# Patient Record
Sex: Female | Born: 1945 | Race: White | Hispanic: No | Marital: Married | Smoking: Never smoker
Health system: Southern US, Community
[De-identification: ages and names within clinical notes are randomized; demographics above are authoritative.]

## PROBLEM LIST (undated history)

## (undated) DIAGNOSIS — E119 Type 2 diabetes mellitus without complications: Secondary | ICD-10-CM

## (undated) DIAGNOSIS — Z9221 Personal history of antineoplastic chemotherapy: Secondary | ICD-10-CM

## (undated) DIAGNOSIS — I4891 Unspecified atrial fibrillation: Secondary | ICD-10-CM

## (undated) DIAGNOSIS — C569 Malignant neoplasm of unspecified ovary: Secondary | ICD-10-CM

## (undated) HISTORY — PX: OOPHORECTOMY: SHX86

## (undated) HISTORY — PX: CHOLECYSTECTOMY: SHX55

## (undated) HISTORY — PX: TUBAL LIGATION: SHX77

## (undated) HISTORY — PX: TONSILLECTOMY: SUR1361

---

## 2012-07-11 DIAGNOSIS — M8580 Other specified disorders of bone density and structure, unspecified site: Secondary | ICD-10-CM | POA: Insufficient documentation

## 2014-07-20 ENCOUNTER — Ambulatory Visit: Payer: Self-pay | Admitting: Physician Assistant

## 2014-07-20 ENCOUNTER — Emergency Department: Payer: Self-pay | Admitting: Emergency Medicine

## 2014-07-20 LAB — URINALYSIS, COMPLETE
BILIRUBIN, UR: NEGATIVE
Glucose,UR: 50 mg/dL (ref 0–75)
Leukocyte Esterase: NEGATIVE
NITRITE: NEGATIVE
PROTEIN: NEGATIVE
Ph: 5 (ref 4.5–8.0)
RBC,UR: 164 /HPF (ref 0–5)
Specific Gravity: 1.023 (ref 1.003–1.030)
WBC UR: 2 /HPF (ref 0–5)

## 2014-07-20 LAB — BASIC METABOLIC PANEL
Anion Gap: 6 — ABNORMAL LOW (ref 7–16)
BUN: 16 mg/dL (ref 7–18)
CHLORIDE: 106 mmol/L (ref 98–107)
CREATININE: 0.95 mg/dL (ref 0.60–1.30)
Calcium, Total: 8.9 mg/dL (ref 8.5–10.1)
Co2: 26 mmol/L (ref 21–32)
EGFR (African American): >60
EGFR (Non-African Amer.): >60
GLUCOSE: 148 mg/dL — AB (ref 65–99)
Osmolality: 280 (ref 275–301)
Potassium: 4 mmol/L (ref 3.5–5.1)
Sodium: 138 mmol/L (ref 136–145)

## 2014-07-20 LAB — CBC
HCT: 38.7 % (ref 35.0–47.0)
HGB: 12.6 g/dL (ref 12.0–16.0)
MCH: 33.2 pg (ref 26.0–34.0)
MCHC: 32.5 g/dL (ref 32.0–36.0)
MCV: 102 fL — ABNORMAL HIGH (ref 80–100)
Platelet: 113 10*3/uL — ABNORMAL LOW (ref 150–440)
RBC: 3.78 10*6/uL — ABNORMAL LOW (ref 3.80–5.20)
RDW: 14.2 % (ref 11.5–14.5)
WBC: 6.7 10*3/uL (ref 3.6–11.0)

## 2014-07-20 LAB — PROTIME-INR
INR: 1.4
Prothrombin Time: 17 secs — ABNORMAL HIGH (ref 11.5–14.7)

## 2014-10-08 ENCOUNTER — Ambulatory Visit: Payer: Self-pay | Admitting: Family Medicine

## 2014-10-11 HISTORY — PX: ABDOMINAL HYSTERECTOMY: SHX81

## 2014-11-19 DIAGNOSIS — Z8543 Personal history of malignant neoplasm of ovary: Secondary | ICD-10-CM | POA: Insufficient documentation

## 2014-11-27 DIAGNOSIS — H40053 Ocular hypertension, bilateral: Secondary | ICD-10-CM | POA: Insufficient documentation

## 2014-12-17 DIAGNOSIS — L738 Other specified follicular disorders: Secondary | ICD-10-CM | POA: Insufficient documentation

## 2015-01-11 DIAGNOSIS — Z79899 Other long term (current) drug therapy: Secondary | ICD-10-CM | POA: Insufficient documentation

## 2015-01-11 DIAGNOSIS — Z5181 Encounter for therapeutic drug level monitoring: Secondary | ICD-10-CM | POA: Insufficient documentation

## 2015-05-27 DIAGNOSIS — Z7901 Long term (current) use of anticoagulants: Secondary | ICD-10-CM | POA: Insufficient documentation

## 2015-05-27 DIAGNOSIS — Z87898 Personal history of other specified conditions: Secondary | ICD-10-CM | POA: Insufficient documentation

## 2015-12-03 DIAGNOSIS — I482 Chronic atrial fibrillation, unspecified: Secondary | ICD-10-CM | POA: Insufficient documentation

## 2016-06-05 DIAGNOSIS — M1711 Unilateral primary osteoarthritis, right knee: Secondary | ICD-10-CM | POA: Insufficient documentation

## 2016-06-16 ENCOUNTER — Ambulatory Visit
Admission: EM | Admit: 2016-06-16 | Discharge: 2016-06-16 | Disposition: A | Discharge status: Home / Self Care | Payer: Medicare Other | Attending: Family Medicine | Admitting: Family Medicine

## 2016-06-16 ENCOUNTER — Ambulatory Visit (INDEPENDENT_AMBULATORY_CARE_PROVIDER_SITE_OTHER): Payer: Medicare Other

## 2016-06-16 DIAGNOSIS — S7001XA Contusion of right hip, initial encounter: Secondary | ICD-10-CM | POA: Diagnosis not present

## 2016-06-16 DIAGNOSIS — S2231XA Fracture of one rib, right side, initial encounter for closed fracture: Secondary | ICD-10-CM | POA: Diagnosis not present

## 2016-06-16 DIAGNOSIS — W19XXXA Unspecified fall, initial encounter: Secondary | ICD-10-CM

## 2016-06-16 DIAGNOSIS — S46911A Strain of unspecified muscle, fascia and tendon at shoulder and upper arm level, right arm, initial encounter: Secondary | ICD-10-CM | POA: Diagnosis not present

## 2016-06-16 HISTORY — DX: Unspecified atrial fibrillation: I48.91

## 2016-06-16 HISTORY — DX: Type 2 diabetes mellitus without complications: E11.9

## 2016-06-16 HISTORY — DX: Malignant neoplasm of unspecified ovary: C56.9

## 2016-06-16 NOTE — Discharge Instructions (Signed)
Contusion A contusion is a deep bruise. Contusions are the result of a blunt injury to tissues and muscle fibers under the skin. The injury causes bleeding under the skin. The skin overlying the contusion may turn blue, purple, or yellow. Minor injuries will give you a painless contusion, but more severe contusions may stay painful and swollen for a few weeks.  CAUSES  This condition is usually caused by a blow, trauma, or direct force to an area of the body. SYMPTOMS  Symptoms of this condition include:  Swelling of the injured area.  Pain and tenderness in the injured area.  Discoloration. The area may have redness and then turn blue, purple, or yellow. DIAGNOSIS  This condition is diagnosed based on a physical exam and medical history. An X-ray, CT scan, or MRI may be needed to determine if there are any associated injuries, such as broken bones (fractures). TREATMENT  Specific treatment for this condition depends on what area of the body was injured. In general, the best treatment for a contusion is resting, icing, applying pressure to (compression), and elevating the injured area. This is often called the RICE strategy. Over-the-counter anti-inflammatory medicines may also be recommended for pain control.  HOME CARE INSTRUCTIONS   Rest the injured area.  If directed, apply ice to the injured area:  Put ice in a plastic bag.  Place a towel between your skin and the bag.  Leave the ice on for 20 minutes, 2-3 times per day.  If directed, apply light compression to the injured area using an elastic bandage. Make sure the bandage is not wrapped too tightly. Remove and reapply the bandage as directed by your health care provider.  If possible, raise (elevate) the injured area above the level of your heart while you are sitting or lying down.  Take over-the-counter and prescription medicines only as told by your health care provider. SEEK MEDICAL CARE IF:  Your symptoms do not  improve after several days of treatment.  Your symptoms get worse.  You have difficulty moving the injured area. SEEK IMMEDIATE MEDICAL CARE IF:   You have severe pain.  You have numbness in a hand or foot.  Your hand or foot turns pale or cold.   This information is not intended to replace advice given to you by your health care provider. Make sure you discuss any questions you have with your health care provider.   Document Released: 09/06/2005 Document Revised: 08/18/2015 Document Reviewed: 04/14/2015 Elsevier Interactive Patient Education 2016 [place]. Rib Fracture A rib fracture is a break or crack in one of the bones of the ribs. The ribs are a group of long, curved bones that wrap around your chest and attach to your spine. They protect your lungs and other organs in the chest cavity. A broken or cracked rib is often painful, but most do not cause other problems. Most rib fractures heal on their own over time. However, rib fractures can be more serious if multiple ribs are broken or if broken ribs move out of place and push against other structures. CAUSES   A direct blow to the chest. For example, this could happen during contact sports, a car accident, or a fall against a hard object.  Repetitive movements with high force, such as pitching a baseball or having severe coughing spells. SYMPTOMS   Pain when you breathe in or cough.  Pain when someone presses on the injured area. DIAGNOSIS  Your caregiver will perform a physical exam.  Various imaging tests may be ordered to confirm the diagnosis and to look for related injuries. These tests may include a chest X-ray, computed tomography (CT), magnetic resonance imaging (MRI), or a bone scan. TREATMENT  Rib fractures usually heal on their own in 1-3 months. The longer healing period is often associated with a continued cough or other aggravating activities. During the healing period, pain control is very important.  Medication is usually given to control pain. Hospitalization or surgery may be needed for more severe injuries, such as those in which multiple ribs are broken or the ribs have moved out of place.  HOME CARE INSTRUCTIONS   Avoid strenuous activity and any activities or movements that cause pain. Be careful during activities and avoid bumping the injured rib.  Gradually increase activity as directed by your caregiver.  Only take over-the-counter or prescription medications as directed by your caregiver. Do not take other medications without asking your caregiver first.  Apply ice to the injured area for the first 1-2 days after you have been treated or as directed by your caregiver. Applying ice helps to reduce inflammation and pain.  Put ice in a plastic bag.  Place a towel between your skin and the bag.   Leave the ice on for 15-20 minutes at a time, every 2 hours while you are awake.  Perform deep breathing as directed by your caregiver. This will help prevent pneumonia, which is a common complication of a broken rib. Your caregiver may instruct you to:  Take deep breaths several times a day.  Try to cough several times a day, holding a pillow against the injured area.  Use a device called an incentive spirometer to practice deep breathing several times a day.  Drink enough fluids to keep your urine clear or pale yellow. This will help you avoid constipation.   Do not wear a rib belt or binder. These restrict breathing, which can lead to pneumonia.  SEEK IMMEDIATE MEDICAL CARE IF:   You have a fever.   You have difficulty breathing or shortness of breath.   You develop a continual cough, or you cough up thick or bloody sputum.  You feel sick to your stomach (nausea), throw up (vomit), or have abdominal pain.   You have worsening pain not controlled with medications.  MAKE SURE YOU:  Understand these instructions.  Will watch your condition.  Will get help right  away if you are not doing well or get worse.   This information is not intended to replace advice given to you by your health care provider. Make sure you discuss any questions you have with your health care provider.   Document Released: 11/27/2005 Document Revised: 07/30/2013 Document Reviewed: 01/29/2013 Elsevier Interactive Patient Education [employer].

## 2016-06-16 NOTE — ED Notes (Signed)
Patient states that on Tuesday evening she went to get out of her chair and she was dizzy. Patient states that she tried to brace herself and she fell onto the hardwood floor at her home. Patient states that she now has right arm muscle pain, right sided rib pain and right sided groin pain. Patient states that pain has worsened since Tuesday.

## 2016-06-16 NOTE — ED Provider Notes (Signed)
CSN: [id number]     Arrival date & time 06/16/16  0902 History   First MD Initiated Contact with Patient 06/16/16 [phone]     Chief Complaint  Patient presents with  . Fall   (Consider location/radiation/quality/duration/timing/severity/associated sxs/prior Treatment) HPI Comments: 70 yo female with a 4 days h/o right sided pain after falling. States she felt dizzy right after standing up from her recliner and fell hitting her right side. Complains of pain to her right mid and lower ribs, right upper arm and right groin area. Denies hitting her head, vision changes, numbness/tingling.   The history is provided by the patient.    Past Medical History  Diagnosis Date  . Diabetes ([place])   . Ovarian cancer ([place])     s/p chemo last treatment 01/2015  . Atrial fibrillation [facility])    Past Surgical History  Procedure Laterality Date  . Tonsillectomy    . Tubal ligation    . Cholecystectomy    . Abdominal hysterectomy  10/2014   Family History  Problem Relation Age of Onset  . Diabetes Mother   . CVA Father   . CVA Mother    Social History  Substance Use Topics  . Smoking status: Never Smoker   . Smokeless tobacco: None  . Alcohol Use: 0.0 oz/week    0 Standard drinks or equivalent per week     Comment: 2 glasses of wine daily   OB History    No data available     Review of Systems  Allergies  Mupirocin  Home Medications   Prior to Admission medications   Medication Sig Start Date End Date Taking? Authorizing Provider  bimatoprost (LUMIGAN) 0.03 % ophthalmic solution 1 drop at bedtime.   Yes Historical Provider, MD  carvedilol (COREG) 25 MG tablet Take 25 mg by mouth 2 (two) times daily with a meal.   Yes Historical Provider, MD  cetirizine (ZYRTEC) 10 MG tablet Take 10 mg by mouth daily.   Yes Historical Provider, MD  ezetimibe (ZETIA) 10 MG tablet Take 10 mg by mouth daily.   Yes Historical Provider, MD  flecainide (TAMBOCOR) 50 MG tablet Take 50 mg by mouth 2 (two) times  daily.   Yes Historical Provider, MD  gabapentin (NEURONTIN) 100 MG capsule Take 100 mg by mouth 3 (three) times daily.   Yes Historical Provider, MD  glucosamine-chondroitin 500-400 MG tablet Take 1 tablet by mouth 3 (three) times daily.   Yes Historical Provider, MD  insulin glargine (LANTUS) 100 UNIT/ML injection Inject 16 Units into the skin at bedtime.   Yes Historical Provider, MD  lisinopril (PRINIVIL,ZESTRIL) 20 MG tablet Take 20 mg by mouth daily.   Yes Historical Provider, MD  milk thistle 175 MG tablet Take 175 mg by mouth daily.   Yes Historical Provider, MD  repaglinide (PRANDIN) 2 MG tablet Take 2 mg by mouth 3 (three) times daily before meals.   Yes Historical Provider, MD  rivaroxaban (XARELTO) 20 MG TABS tablet Take 20 mg by mouth daily with supper.   Yes Historical Provider, MD  vitamin B-12 (CYANOCOBALAMIN) 1000 MCG tablet Take 1,000 mcg by mouth daily.   Yes Historical Provider, MD   Meds Ordered and Administered this Visit  Medications - No data to display  BP 150/103 mmHg  Pulse 82  Temp(Src) 98.4 F (36.9 C) (Tympanic)  Resp 17  Ht 5\' 4"  (1.626 m)  Wt 160 lb (72.576 kg)  BMI 27.45 kg/m2  SpO2 99% No data found.  Physical Exam  Constitutional: She appears well-developed and well-nourished. No distress.  HENT:  Head: Normocephalic and atraumatic.  Right Ear: Tympanic membrane, external ear and ear canal normal.  Left Ear: Tympanic membrane, external ear and ear canal normal.  Nose: No nose lacerations, sinus tenderness, nasal deformity, septal deviation or nasal septal hematoma. No epistaxis.  No foreign bodies.  Mouth/Throat: Uvula is midline, oropharynx is clear and moist and mucous membranes are normal. No oropharyngeal exudate.  Eyes: Conjunctivae and EOM are normal. Pupils are equal, round, and reactive to light. Right eye exhibits no discharge. Left eye exhibits no discharge. No scleral icterus.  Neck: Normal range of motion. Neck supple. No thyromegaly  present.  Cardiovascular: Normal rate, regular rhythm and normal heart sounds.   Pulmonary/Chest: Effort normal and breath sounds normal. No respiratory distress. She has no wheezes. She has no rales. She exhibits tenderness (right mid and lower rib area).  Musculoskeletal: She exhibits tenderness.       Right hip: She exhibits tenderness. She exhibits normal range of motion, normal strength, no swelling, no crepitus, no deformity and no laceration.  Right upper arm neurovascularly intact; normal ROM; no bony tenderness.     Lymphadenopathy:    She has no cervical adenopathy.  Neurological: She is alert.  Skin: She is not diaphoretic.  Nursing note and vitals reviewed.   ED Course  Procedures (including critical care time)  Labs Review Labs Reviewed - No data to display  Imaging Review Dg Ribs Unilateral W/chest Right  06/16/2016  CLINICAL DATA:  Fall 4 days ago injuring right lateral ribs. Bruising lateral side of breast. Right hip pain. EXAM: RIGHT RIBS AND CHEST - 3+ VIEW COMPARISON:  None. FINDINGS: Heart size is upper normal. Overall cardiomediastinal silhouette is within normal limits in size and configuration. Lungs are at least mildly hyperexpanded suggesting COPD. Lungs are clear. No pleural effusion or pneumothorax seen. There is a a minimally displaced fracture of the right lateral sixth rib. No other fracture identified. IMPRESSION: 1. Minimally displaced fracture of the right lateral sixth rib. 2. Probable COPD. 3. Lungs are clear.  No pleural effusion or pneumothorax seen. Electronically Signed   By: Franki Cabot M.D.   On: 06/16/2016 10:47   Dg Hip Unilat With Pelvis 2-3 Views Right  06/16/2016  CLINICAL DATA:  [place] 4 days ago, right hip pain, history of scoliosis EXAM: DG HIP (WITH OR WITHOUT PELVIS) 2-3V RIGHT COMPARISON:  None. FINDINGS: Three views of the right hip submitted. No acute fracture or subluxation. Bilateral hip joints are symmetrical in appearance. Degenerative  changes pubic symphysis. Mild lower lumbar dextroscoliosis. Degenerative changes lumbar spine. There is about 7 mm right lateral subluxation L3 on L4 vertebral body. IMPRESSION: No acute fracture or subluxation. Degenerative changes pubic symphysis and lower lumbar spine. Electronically Signed   By: Lahoma Crocker M.D.   On: 06/16/2016 10:43     Visual Acuity Review  Right Eye Distance:   Left Eye Distance:   Bilateral Distance:    Right Eye Near:   Left Eye Near:    Bilateral Near:         MDM   1. Fracture, rib, right, closed, initial encounter   2. Fall   3. Contusion, hip, right, initial encounter   4. Strain of upper arm, right, initial encounter    Discharge Medication List as of 06/16/2016 11:42 AM    1. x-ray results and diagnosis reviewed with patient 2. Offered rx for pain medication, however  patient refuses; states will take otc acetaminophen and continue her gabapentin 3. Recommend supportive treatment with ice to area 4. Follow-up prn if symptoms worsen or don't improve    Norval Gable, MD 06/16/16 [phone]

## 2016-09-04 ENCOUNTER — Other Ambulatory Visit: Payer: Self-pay | Admitting: Family Medicine

## 2016-09-04 DIAGNOSIS — Z1231 Encounter for screening mammogram for malignant neoplasm of breast: Secondary | ICD-10-CM

## 2016-09-19 ENCOUNTER — Ambulatory Visit: Admission: RE | Admit: 2016-09-19 | Payer: Medicare Other | Source: Ambulatory Visit

## 2016-09-21 ENCOUNTER — Ambulatory Visit
Admission: RE | Admit: 2016-09-21 | Discharge: 2016-09-21 | Disposition: A | Discharge status: Home / Self Care | Payer: Medicare Other | Source: Ambulatory Visit | Attending: Family Medicine | Admitting: Family Medicine

## 2016-09-21 ENCOUNTER — Other Ambulatory Visit: Payer: Self-pay | Admitting: Family Medicine

## 2016-09-21 DIAGNOSIS — Z1231 Encounter for screening mammogram for malignant neoplasm of breast: Secondary | ICD-10-CM | POA: Insufficient documentation

## 2016-09-21 HISTORY — DX: Personal history of antineoplastic chemotherapy: Z92.21

## 2016-11-23 DIAGNOSIS — M2170 Unequal limb length (acquired), unspecified site: Secondary | ICD-10-CM | POA: Insufficient documentation

## 2016-11-24 DIAGNOSIS — M533 Sacrococcygeal disorders, not elsewhere classified: Secondary | ICD-10-CM | POA: Insufficient documentation

## 2017-04-11 DIAGNOSIS — Z5309 Procedure and treatment not carried out because of other contraindication: Secondary | ICD-10-CM | POA: Insufficient documentation

## 2017-04-27 ENCOUNTER — Ambulatory Visit (INDEPENDENT_AMBULATORY_CARE_PROVIDER_SITE_OTHER): Payer: Medicare Other | Admitting: Podiatry

## 2017-04-27 DIAGNOSIS — M2170 Unequal limb length (acquired), unspecified site: Secondary | ICD-10-CM

## 2017-04-27 DIAGNOSIS — L600 Ingrowing nail: Secondary | ICD-10-CM

## 2017-04-27 NOTE — Progress Notes (Signed)
   Subjective: Patient presents today as a new patient for evaluation of pain and tenderness to the 4th digit left foot. Patient states that she's had pain to the lateral aspect with skin flaking from the lateral nail fold for several months now. Very tender to palpation. Patient also complains a little length discrepancy with the right lower extremity shorter than the left lower extremity. She denies trauma regarding the lower extremities or any lower extremity surgery however she does state that that limb length discrepancy contributes to her diagnosis of scoliosis.  Objective:  General: Well developed, nourished, in no acute distress, alert and oriented x3   Dermatology: Skin is warm, dry and supple bilateral. Lateral border of the fourth digit left foot appears to be erythematous with evidence of an ingrowing nail. Pain on palpation noted to the border of the nail fold. The remaining nails appear unremarkable at this time. There are no open sores, lesions.  Vascular: Dorsalis Pedis artery and Posterior Tibial artery pedal pulses palpable. No lower extremity edema noted.   Neruologic: Grossly intact via light touch bilateral.  Musculoskeletal: Limb length discrepancy noted with the right lower extremity shortened left lower extremity of approximately 1.5 cm.   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 Patient evaluated.  2. Discussed treatment alternatives and plan of care. Explained nail avulsion procedure and post procedure course to patient. 3. Patient opted for permanent partial nail avulsion.  4. Prior to procedure, local anesthesia infiltration utilized using 3 ml of a 50:50 mixture of 2% plain lidocaine and 0.5% plain marcaine in a normal hallux block fashion and a betadine prep performed.  5. Partial permanent nail avulsion with chemical  matrixectomy performed using [id number] applications of phenol followed by alcohol flush.  6. Light dressing applied. 7. Today were going to schedule appointment with [facility] for accommodation of the limb length discrepancy. 8. Postoperative shoe was dispensed for the patient.  9. Today authorization for diabetic shoes was initiated 10. Return to clinic in 2 weeks.   Edrick Kins, DPM [facility]  Dr. Edrick Kins, [address]                                        [place], [place] [address]                Office [phone]  Fax [phone]

## 2017-04-27 NOTE — Patient Instructions (Signed)

## 2017-05-18 ENCOUNTER — Encounter: Payer: Self-pay | Admitting: Podiatry

## 2017-05-18 ENCOUNTER — Ambulatory Visit (INDEPENDENT_AMBULATORY_CARE_PROVIDER_SITE_OTHER): Payer: Medicare Other | Admitting: Podiatry

## 2017-05-18 DIAGNOSIS — I5189 Other ill-defined heart diseases: Secondary | ICD-10-CM | POA: Insufficient documentation

## 2017-05-18 DIAGNOSIS — K760 Fatty (change of) liver, not elsewhere classified: Secondary | ICD-10-CM | POA: Insufficient documentation

## 2017-05-18 DIAGNOSIS — I2510 Atherosclerotic heart disease of native coronary artery without angina pectoris: Secondary | ICD-10-CM | POA: Insufficient documentation

## 2017-05-18 DIAGNOSIS — M2170 Unequal limb length (acquired), unspecified site: Secondary | ICD-10-CM | POA: Diagnosis not present

## 2017-05-18 DIAGNOSIS — I10 Essential (primary) hypertension: Secondary | ICD-10-CM | POA: Insufficient documentation

## 2017-05-18 DIAGNOSIS — E119 Type 2 diabetes mellitus without complications: Secondary | ICD-10-CM | POA: Insufficient documentation

## 2017-05-18 DIAGNOSIS — E785 Hyperlipidemia, unspecified: Secondary | ICD-10-CM | POA: Insufficient documentation

## 2017-05-18 DIAGNOSIS — K589 Irritable bowel syndrome without diarrhea: Secondary | ICD-10-CM | POA: Insufficient documentation

## 2017-05-20 NOTE — Progress Notes (Signed)
   Subjective: Patient presents today 2 weeks post ingrown nail permanent nail avulsion procedure of the lateral border of the fourth digit of the left foot. Patient states that the toe and nail fold is feeling much better. She has appt with Liliane Channel on 05/30/17.  Objective: Skin is warm, dry and supple. Nail and respective nail fold appears to be healing appropriately. Open wound to the associated nail fold with a granular wound base and moderate amount of fibrotic tissue. Minimal drainage noted. Mild erythema around the periungual region likely due to phenol chemical matricectomy.  Assessment: #1 postop permanent partial nail avulsion lateral border fourth digit left foot #2 open wound periungual nail fold of respective digit.  #3 for limb length discrepancy-right shorter than left  Plan of care: #1 patient was evaluated  #2 debridement of open wound was performed to the periungual border of the respective toe using a currette. Antibiotic ointment and Band-Aid was applied. #3 Keep appt with Liliane Channel #4 patient is to return to clinic on a PRN  basis.   Edrick Kins, DPM [facility]  Dr. Edrick Kins, [address]                                        [place], [place] [address]                Office [phone]  Fax [phone]

## 2017-05-23 ENCOUNTER — Ambulatory Visit: Payer: Medicare Other

## 2017-05-24 ENCOUNTER — Ambulatory Visit: Payer: Medicare Other

## 2017-05-30 ENCOUNTER — Ambulatory Visit (INDEPENDENT_AMBULATORY_CARE_PROVIDER_SITE_OTHER): Payer: Medicare Other | Admitting: Podiatry

## 2017-05-30 DIAGNOSIS — M2170 Unequal limb length (acquired), unspecified site: Secondary | ICD-10-CM

## 2017-05-30 DIAGNOSIS — M2011 Hallux valgus (acquired), right foot: Secondary | ICD-10-CM

## 2017-05-30 DIAGNOSIS — M2012 Hallux valgus (acquired), left foot: Secondary | ICD-10-CM

## 2017-05-30 NOTE — Progress Notes (Signed)
Patient presents today for shoe lift to address LLD (R). Patient measures approx 3/8 inch discrepncy; however options were discussed and it has been determined best course of action would be DM 2 shoes and inserts with 1/4" lift on right.  Patient chose shoes and were cast in foam for inserts. Paper work wll be sent to Dr Burnett Kanaris at [facility].

## 2017-07-11 ENCOUNTER — Ambulatory Visit: Payer: Medicare Other | Admitting: Orthotics

## 2017-08-08 ENCOUNTER — Ambulatory Visit (INDEPENDENT_AMBULATORY_CARE_PROVIDER_SITE_OTHER): Payer: Medicare Other | Admitting: Orthotics

## 2017-08-08 DIAGNOSIS — M2012 Hallux valgus (acquired), left foot: Secondary | ICD-10-CM

## 2017-08-08 DIAGNOSIS — M2011 Hallux valgus (acquired), right foot: Secondary | ICD-10-CM | POA: Diagnosis not present

## 2017-08-08 DIAGNOSIS — M2170 Unequal limb length (acquired), unspecified site: Secondary | ICD-10-CM

## 2017-08-08 DIAGNOSIS — E119 Type 2 diabetes mellitus without complications: Secondary | ICD-10-CM | POA: Diagnosis not present

## 2017-08-08 DIAGNOSIS — E1141 Type 2 diabetes mellitus with diabetic mononeuropathy: Secondary | ICD-10-CM

## 2017-08-09 NOTE — Progress Notes (Signed)

## 2017-10-05 ENCOUNTER — Other Ambulatory Visit: Payer: Self-pay | Admitting: Family Medicine

## 2017-10-05 DIAGNOSIS — Z1231 Encounter for screening mammogram for malignant neoplasm of breast: Secondary | ICD-10-CM

## 2017-10-29 ENCOUNTER — Ambulatory Visit
Admission: RE | Admit: 2017-10-29 | Discharge: 2017-10-29 | Disposition: A | Discharge status: Home / Self Care | Payer: Medicare Other | Source: Ambulatory Visit | Attending: Family Medicine | Admitting: Family Medicine

## 2017-10-29 DIAGNOSIS — Z1231 Encounter for screening mammogram for malignant neoplasm of breast: Secondary | ICD-10-CM | POA: Diagnosis present

## 2018-08-05 ENCOUNTER — Ambulatory Visit: Payer: Medicare Other | Admitting: Podiatry

## 2018-08-05 ENCOUNTER — Encounter: Payer: Self-pay | Admitting: Podiatry

## 2018-08-05 DIAGNOSIS — M2040 Other hammer toe(s) (acquired), unspecified foot: Secondary | ICD-10-CM

## 2018-08-05 DIAGNOSIS — M76829 Posterior tibial tendinitis, unspecified leg: Secondary | ICD-10-CM | POA: Diagnosis not present

## 2018-08-05 NOTE — Progress Notes (Signed)
This patient presents to the office with 2 separate complaints.  She says she has a painful hammertoe second digit left foot that is painful walking and wearing her shoes.  She says this hammertoe is painful walking and wearing her shoes.  She also says she has pain through the arch of right foot.   This pain has been present for approximately 6-7 weeks.  She says she experiences pain upon rising in the morning and stepping onto the floor.  She says her foot then improves only to become painful by the end of the day.  She has no history of trauma or injury to the foot.  She was previously seen by Dr. Amalia Hailey who treated her limb lengths through [facility].   She says she was unable to wear the heel lift d ue to back  pain.  She presents the office today for an evaluation of her hammertoe, but especially painful right foot.   General Appearance  Alert, conversant and in no acute stress.  Vascular  Dorsalis pedis and posterior tibial  pulses are palpable  bilaterally.  Capillary return is within normal limits  bilaterally. Temperature is within normal limits  bilaterally.  Neurologic  Senn-Weinstein monofilament wire test within normal limits  bilaterally. Muscle power within normal limits bilaterally.  Nails  Normal nails noted with no evidence of bacterial or fungal infection.  Orthopedic  No limitations of motion of motion feet .  No crepitus or effusions noted.  Severe HAV deformity first MPJ bilateral, with hammer toes 2 through 5 bilateral.  Examination of her rear foot reveals pain along the course of the posterior tibial tendon, right foot.  Pain at the medial aspect of the right foot.  Upon standing, she has excessive pronation with posterior tibial tendon dysfunction.  Skin  normotropic skin with no porokeratosis noted bilaterally.  No signs of infections or ulcers noted.   PTTD right foot.  Hammer toe second left  ROV  Examination of her right foot reveals excessive pronation upon weight bearing.     Prescribed power step insoles for this patient.  She says initially she feels much better and there is less pain to the tendon right ankle.  Padding was dispensed to wear on the second toe left foot.   RTC 10 days.   Gardiner Barefoot DPM

## 2018-08-19 ENCOUNTER — Encounter: Payer: Self-pay | Admitting: Podiatry

## 2018-08-19 ENCOUNTER — Ambulatory Visit: Payer: Medicare Other | Admitting: Podiatry

## 2018-08-19 DIAGNOSIS — M76829 Posterior tibial tendinitis, unspecified leg: Secondary | ICD-10-CM

## 2018-08-19 DIAGNOSIS — E1141 Type 2 diabetes mellitus with diabetic mononeuropathy: Secondary | ICD-10-CM | POA: Diagnosis not present

## 2018-08-19 DIAGNOSIS — M2040 Other hammer toe(s) (acquired), unspecified foot: Secondary | ICD-10-CM

## 2018-08-19 NOTE — Progress Notes (Signed)
This patient presents to the office follow-up for a painful right foot.  Patient has posterior tibial tendon dysfunction, which has led to excessive pronation on her right foot.  She was evaluated and treated with power step insoles to be worn in her right foot.  She says that she is about 50 % improved in 2 weeks wearing the powerstep insoles.  She is pleased with her 2 week progress.  She returns the office today for an evaluation and treatment of her right foot.  Vascular  Dorsalis pedis and posterior tibial pulses are palpable  B/L.  Capillary return  WNL.  Temperature gradient is  WNL.  Skin turgor  WNL  Sensorium  Senn Weinstein monofilament wire  WNL. Normal tactile sensation.  Nail Exam  Patient has normal nails with no evidence of bacterial or fungal infection.  Orthopedic  Exam  Muscle tone and muscle strength  WNL.  No limitations of motion feet  B/L.  No crepitus or joint effusion noted.  Examination of her posterior tibial tendon, right foot reveals less swelling  and pain right foot.      Skin  No open lesions.  Normal skin texture and turgor.   PTTD right foot  ROV.  Discussed this condition with this patient.  We decided to continue to wear the power step insoles for another 2 weeks.  At that juncture, we can reevaluate her for possible orthotic therapy or an AFO device.     Gardiner Barefoot DPM

## 2018-10-14 ENCOUNTER — Other Ambulatory Visit: Payer: Self-pay | Admitting: Family Medicine

## 2018-10-14 DIAGNOSIS — Z1231 Encounter for screening mammogram for malignant neoplasm of breast: Secondary | ICD-10-CM

## 2018-10-30 ENCOUNTER — Encounter (INDEPENDENT_AMBULATORY_CARE_PROVIDER_SITE_OTHER): Payer: Self-pay

## 2018-10-30 ENCOUNTER — Ambulatory Visit
Admission: RE | Admit: 2018-10-30 | Discharge: 2018-10-30 | Disposition: A | Discharge status: Home / Self Care | Payer: Medicare Other | Source: Ambulatory Visit | Attending: Family Medicine | Admitting: Family Medicine

## 2018-10-30 DIAGNOSIS — Z1231 Encounter for screening mammogram for malignant neoplasm of breast: Secondary | ICD-10-CM | POA: Insufficient documentation

## 2019-08-23 IMAGING — MG DIGITAL SCREENING BILATERAL MAMMOGRAM WITH TOMO AND CAD
8 series · 8 of 24 positions shown · non-contrast
Comparison: Previous exam(s).

CLINICAL DATA: Screening.

EXAM:
DIGITAL SCREENING BILATERAL MAMMOGRAM WITH TOMO AND CAD

[R CC synth-2D]
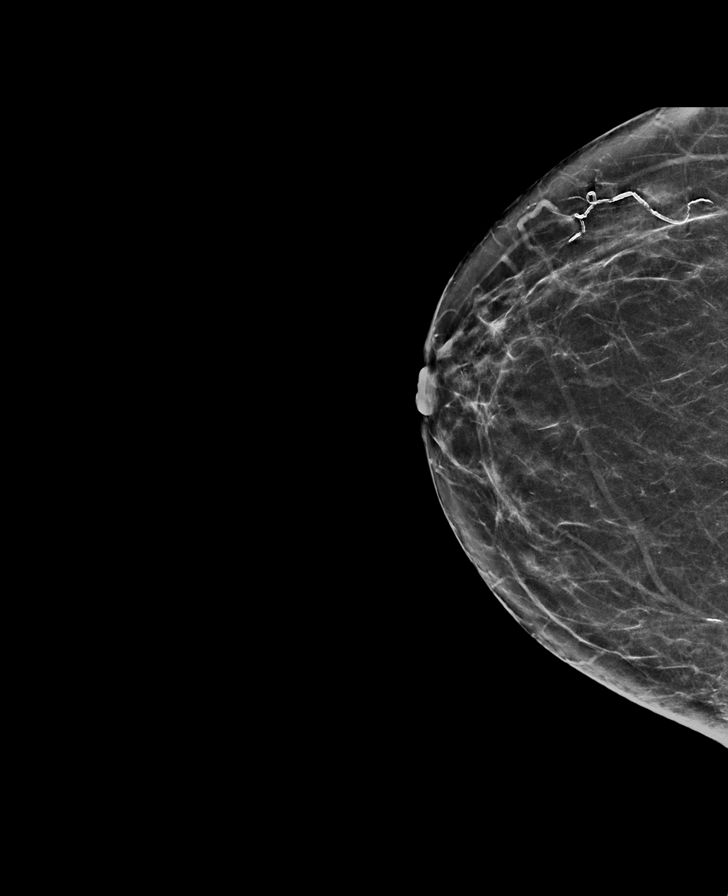

[L CC synth-2D]
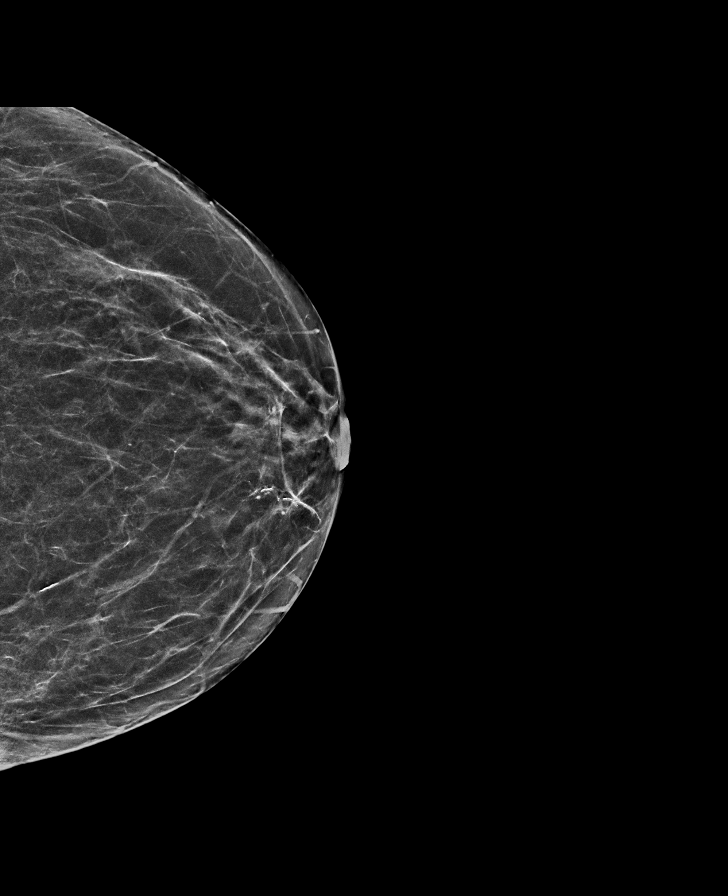

[L MLO synth-2D]
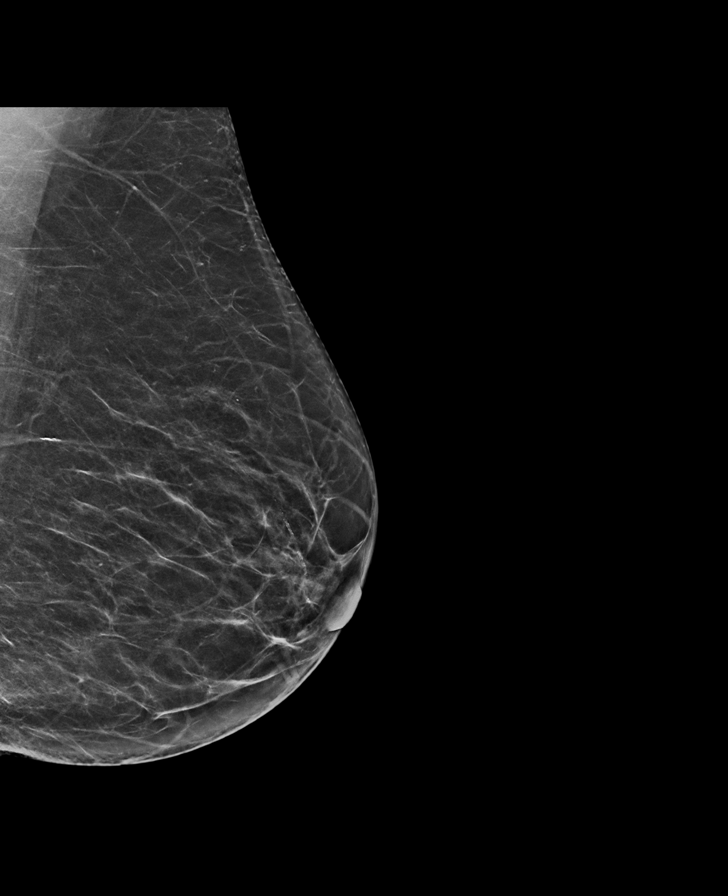

[R MLO synth-2D]
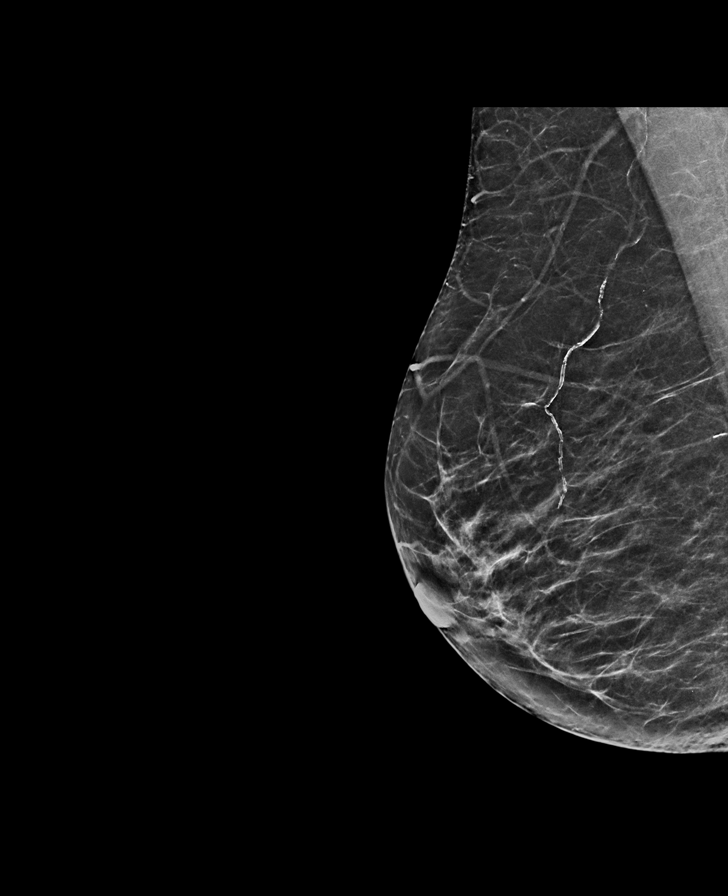

[L CC tomo · tomo slice 31/60.0]
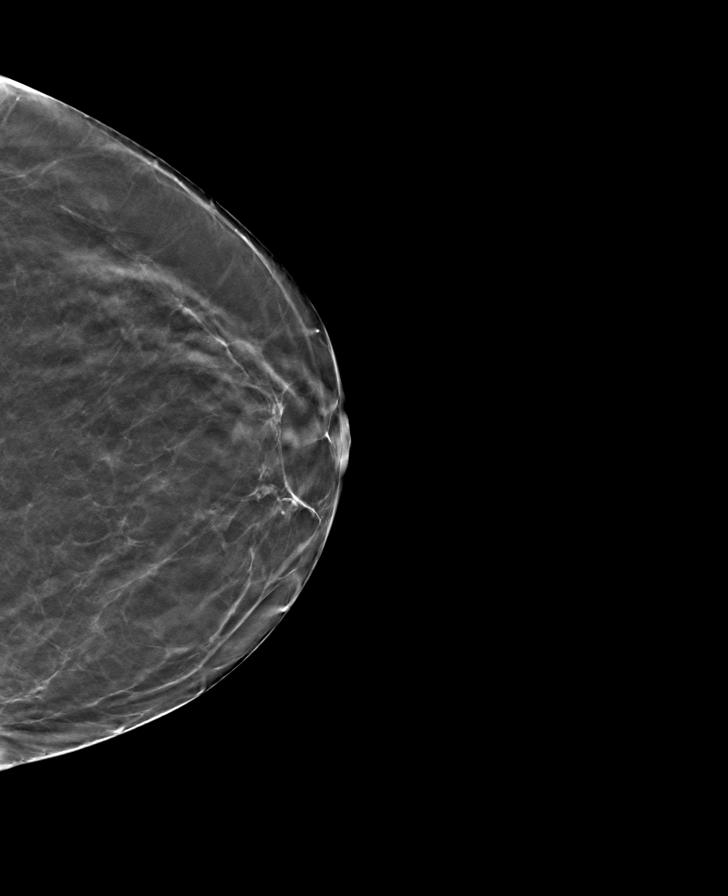

[L MLO tomo · tomo slice 33/66.0]
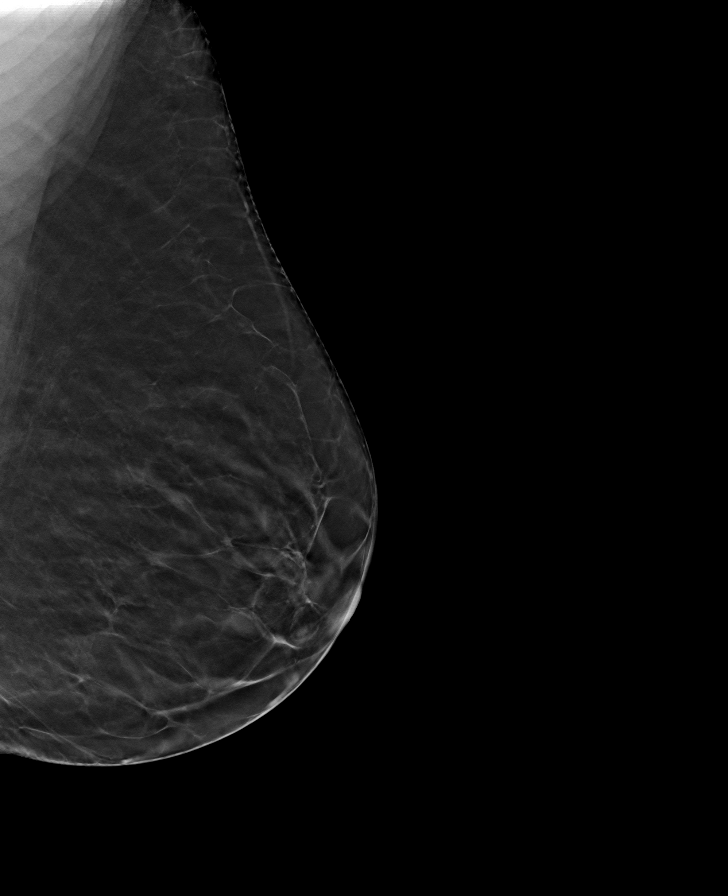

[R CC tomo · tomo slice 33/66.0]
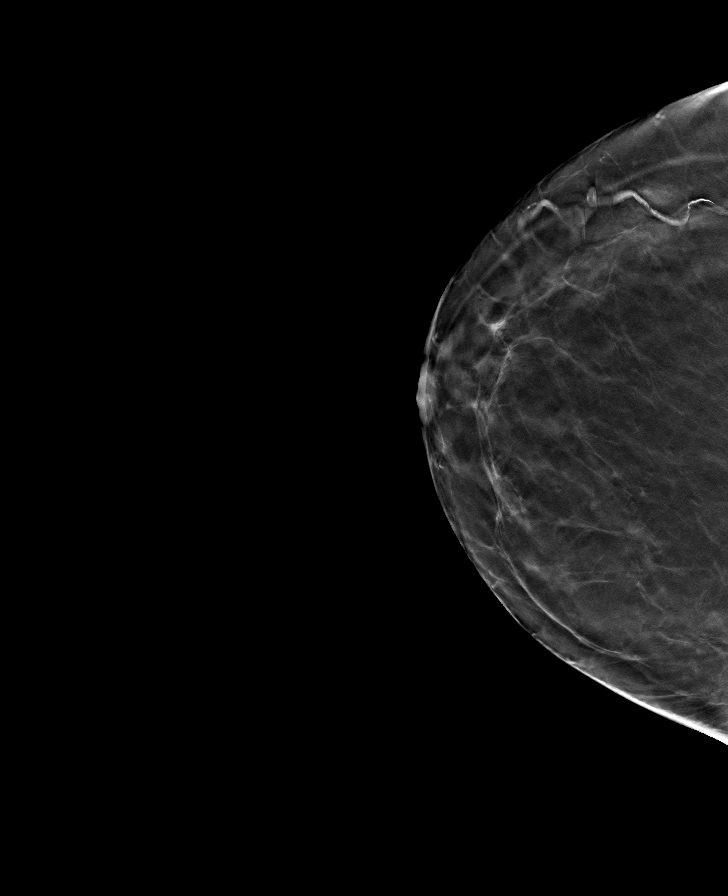

[R MLO tomo · tomo slice 33/65.0]
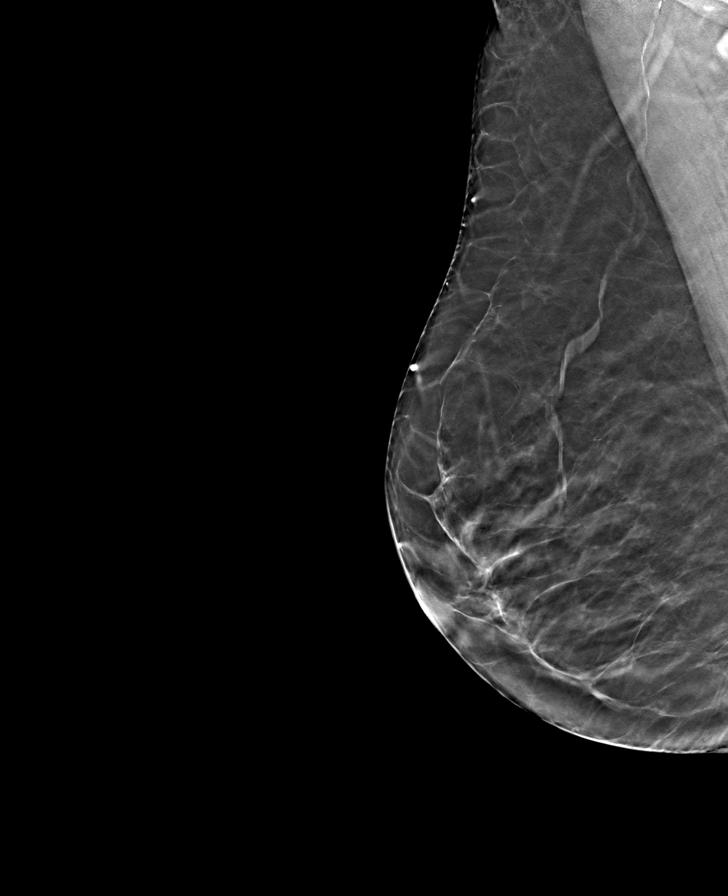

[8 of 24 positions shown; findings below may reference images not displayed]

ACR Breast Density Category b: There are scattered areas of
fibroglandular density.
FINDINGS: There are no findings suspicious for malignancy. Images were
processed with CAD.
IMPRESSION: No mammographic evidence of malignancy. A result letter of this
screening mammogram will be mailed directly to the patient.

RECOMMENDATION:
Screening mammogram in one year. (Code:[id number])

BI-RADS CATEGORY  1: Negative.

## 2019-10-01 ENCOUNTER — Other Ambulatory Visit: Payer: Self-pay | Admitting: Family Medicine

## 2019-10-01 DIAGNOSIS — Z1231 Encounter for screening mammogram for malignant neoplasm of breast: Secondary | ICD-10-CM

## 2019-11-04 ENCOUNTER — Other Ambulatory Visit: Payer: Self-pay

## 2019-11-04 ENCOUNTER — Ambulatory Visit
Admission: RE | Admit: 2019-11-04 | Discharge: 2019-11-04 | Disposition: A | Discharge status: Home / Self Care | Payer: Medicare Other | Source: Ambulatory Visit | Attending: Family Medicine | Admitting: Family Medicine

## 2019-11-04 DIAGNOSIS — Z1231 Encounter for screening mammogram for malignant neoplasm of breast: Secondary | ICD-10-CM | POA: Diagnosis not present

## 2020-10-08 ENCOUNTER — Other Ambulatory Visit: Payer: Self-pay | Admitting: Family Medicine

## 2020-10-08 DIAGNOSIS — Z1231 Encounter for screening mammogram for malignant neoplasm of breast: Secondary | ICD-10-CM

## 2020-11-15 ENCOUNTER — Ambulatory Visit
Admission: RE | Admit: 2020-11-15 | Discharge: 2020-11-15 | Disposition: A | Discharge status: Home / Self Care | Payer: Medicare PPO | Source: Ambulatory Visit | Attending: Family Medicine | Admitting: Family Medicine

## 2020-11-15 ENCOUNTER — Other Ambulatory Visit: Payer: Self-pay

## 2020-11-15 DIAGNOSIS — Z1231 Encounter for screening mammogram for malignant neoplasm of breast: Secondary | ICD-10-CM | POA: Insufficient documentation

## 2022-06-19 ENCOUNTER — Other Ambulatory Visit: Payer: Self-pay | Admitting: Family Medicine

## 2022-06-19 DIAGNOSIS — Z1231 Encounter for screening mammogram for malignant neoplasm of breast: Secondary | ICD-10-CM

## 2022-07-12 ENCOUNTER — Ambulatory Visit
Admission: RE | Admit: 2022-07-12 | Discharge: 2022-07-12 | Disposition: A | Discharge status: Home / Self Care | Payer: Medicare PPO | Source: Ambulatory Visit | Attending: Family Medicine | Admitting: Family Medicine

## 2022-07-12 DIAGNOSIS — Z1231 Encounter for screening mammogram for malignant neoplasm of breast: Secondary | ICD-10-CM | POA: Diagnosis not present

## 2022-12-06 ENCOUNTER — Other Ambulatory Visit: Payer: Self-pay | Admitting: Physical Medicine & Rehabilitation

## 2022-12-06 DIAGNOSIS — G8929 Other chronic pain: Secondary | ICD-10-CM

## 2022-12-13 ENCOUNTER — Ambulatory Visit
Admission: RE | Admit: 2022-12-13 | Discharge: 2022-12-13 | Disposition: A | Discharge status: Home / Self Care | Payer: Medicare PPO | Source: Ambulatory Visit | Attending: Physical Medicine & Rehabilitation | Admitting: Physical Medicine & Rehabilitation

## 2022-12-13 DIAGNOSIS — M5441 Lumbago with sciatica, right side: Secondary | ICD-10-CM | POA: Diagnosis present

## 2022-12-13 DIAGNOSIS — G8929 Other chronic pain: Secondary | ICD-10-CM | POA: Insufficient documentation

## 2022-12-13 DIAGNOSIS — M5442 Lumbago with sciatica, left side: Secondary | ICD-10-CM | POA: Diagnosis present

## 2023-08-20 ENCOUNTER — Other Ambulatory Visit: Payer: Self-pay | Admitting: Family Medicine

## 2023-08-20 DIAGNOSIS — Z1231 Encounter for screening mammogram for malignant neoplasm of breast: Secondary | ICD-10-CM

## 2023-09-26 ENCOUNTER — Ambulatory Visit
Admission: RE | Admit: 2023-09-26 | Discharge: 2023-09-26 | Disposition: A | Discharge status: Home / Self Care | Payer: Medicare PPO | Source: Ambulatory Visit | Attending: Family Medicine | Admitting: Family Medicine

## 2023-09-26 DIAGNOSIS — Z1231 Encounter for screening mammogram for malignant neoplasm of breast: Secondary | ICD-10-CM | POA: Diagnosis present

## 2024-08-14 ENCOUNTER — Other Ambulatory Visit: Payer: Self-pay | Admitting: Family Medicine

## 2024-08-14 DIAGNOSIS — Z1231 Encounter for screening mammogram for malignant neoplasm of breast: Secondary | ICD-10-CM

## 2024-09-30 ENCOUNTER — Ambulatory Visit
Admission: RE | Admit: 2024-09-30 | Discharge: 2024-09-30 | Disposition: A | Discharge status: Home / Self Care | Source: Ambulatory Visit | Attending: Family Medicine | Admitting: Family Medicine

## 2024-09-30 DIAGNOSIS — Z1231 Encounter for screening mammogram for malignant neoplasm of breast: Secondary | ICD-10-CM | POA: Diagnosis present

## 2025-03-05 ENCOUNTER — Ambulatory Visit: Admitting: Gastroenterology
